# Patient Record
Sex: Male | Born: 1935 | Race: White | Hispanic: No | Marital: Married
Health system: Southern US, Community
[De-identification: ages and names within clinical notes are randomized; demographics above are authoritative.]

---

## 2005-06-25 ENCOUNTER — Emergency Department: Payer: Self-pay | Admitting: Emergency Medicine

## 2005-07-04 ENCOUNTER — Emergency Department: Payer: Self-pay | Admitting: Emergency Medicine

## 2007-06-13 ENCOUNTER — Emergency Department: Payer: Self-pay | Admitting: Emergency Medicine

## 2007-06-21 ENCOUNTER — Emergency Department: Payer: Self-pay | Admitting: Emergency Medicine

## 2008-10-06 ENCOUNTER — Emergency Department: Payer: Self-pay | Admitting: Emergency Medicine

## 2010-06-03 IMAGING — CT CT CERVICAL SPINE WITHOUT CONTRAST
3 series · 17 of 35 positions shown, 19 images · non-contrast
Comparison: none

REASON FOR EXAM: pain after head trauma
COMMENTS:

[Series 3: axial · axial · 0.34mm/px · z∈[+71,+202]mm · 8 of 81 slices shown, 10 images]
[im 7/81  soft-tissue]
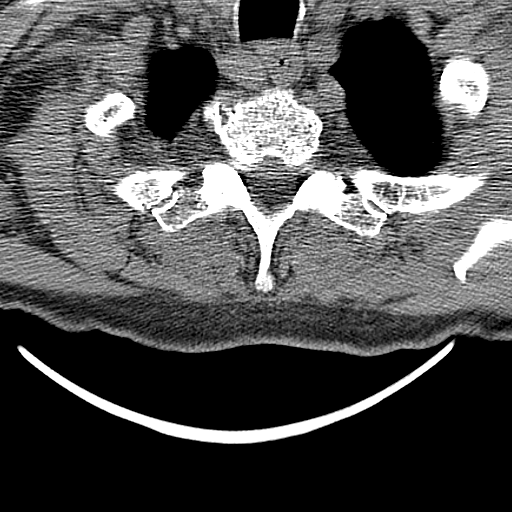
[im 7/81  bone]
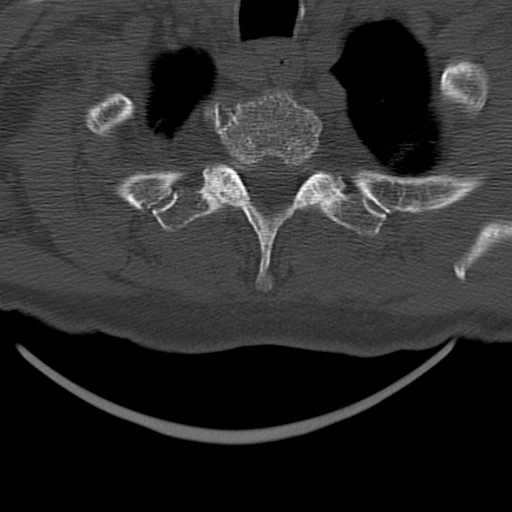
[im 19/81  bone]
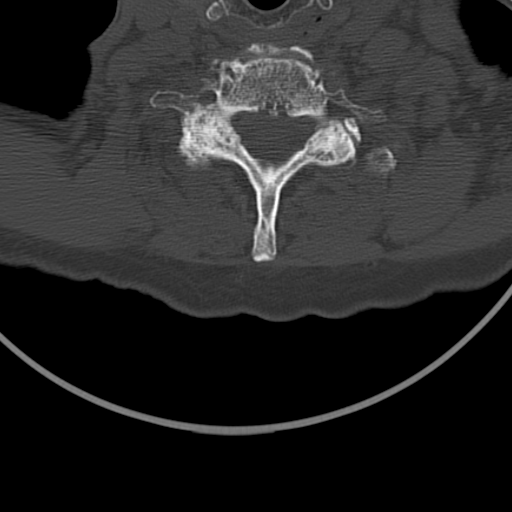
[im 25/81  bone]
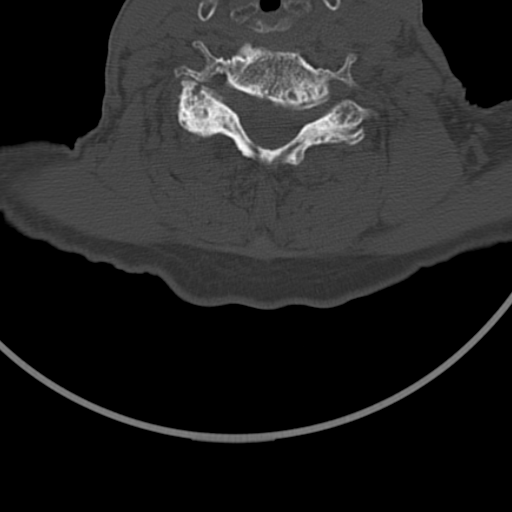
[im 37/81  bone]
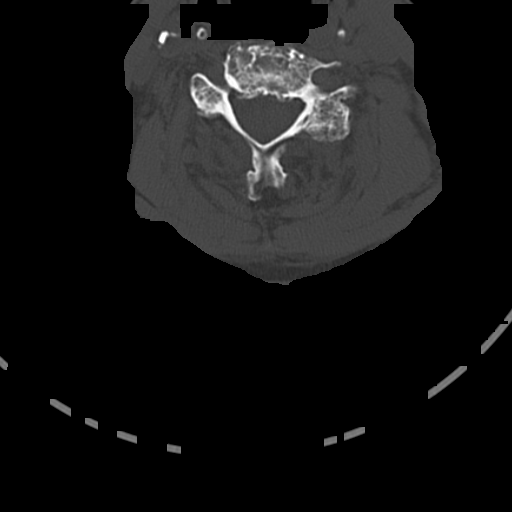
[im 44/81  soft-tissue]
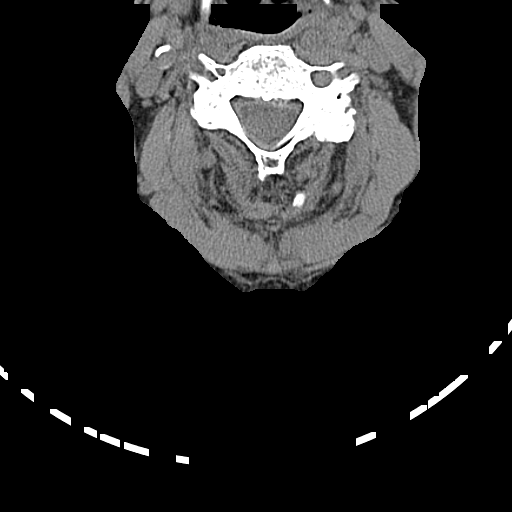
[im 44/81  bone]
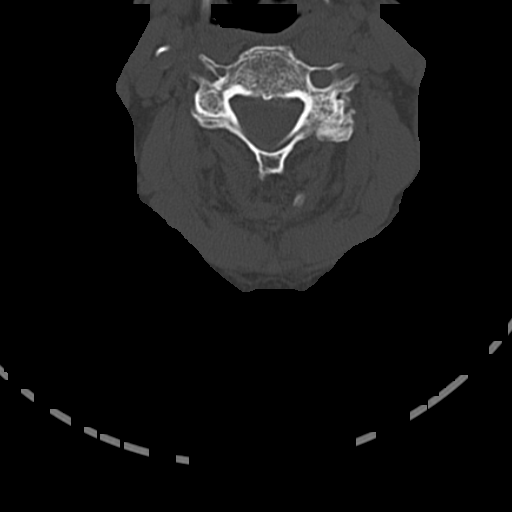
[im 56/81  bone]
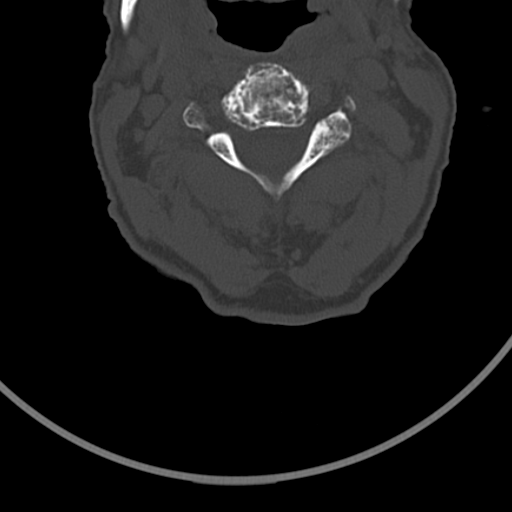
[im 62/81  bone]
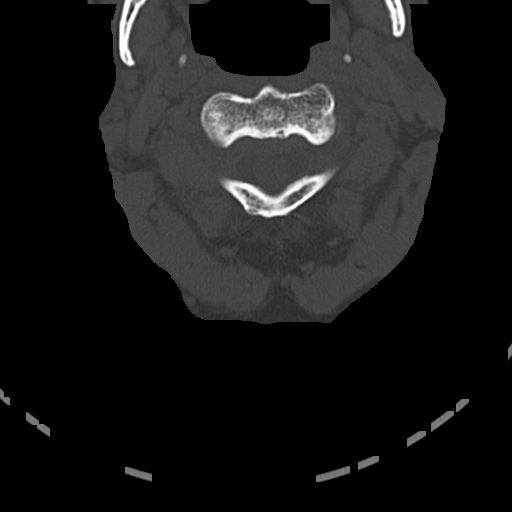
[im 74/81  bone]
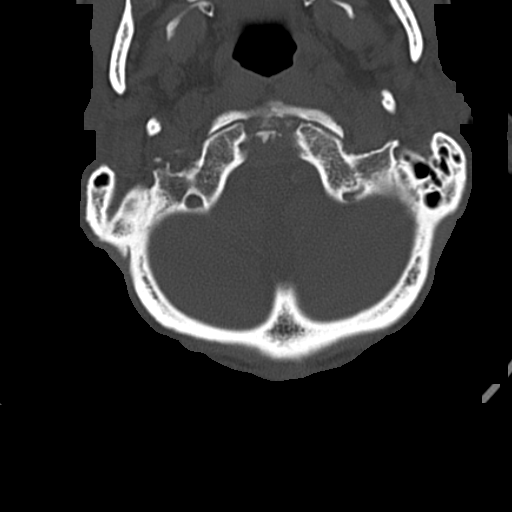

[Series 4: sagittal · sagittal · 0.42mm/px · 6 of 48 slices shown]
[im 24/48  soft-tissue]
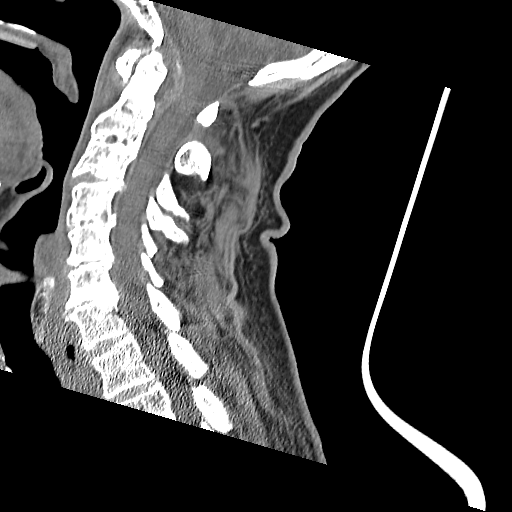
[im 31/48  bone]
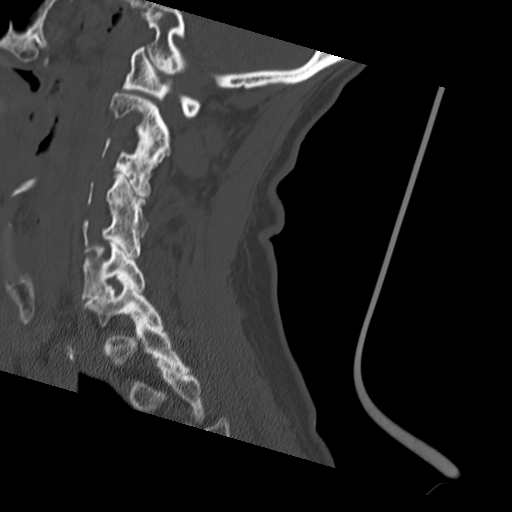
[im 33/48  bone]
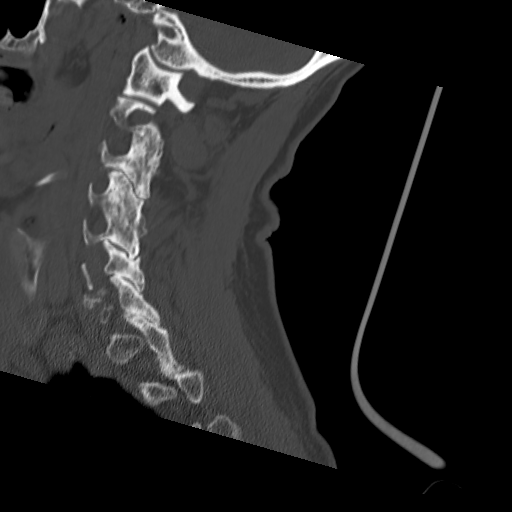
[im 35/48  bone]
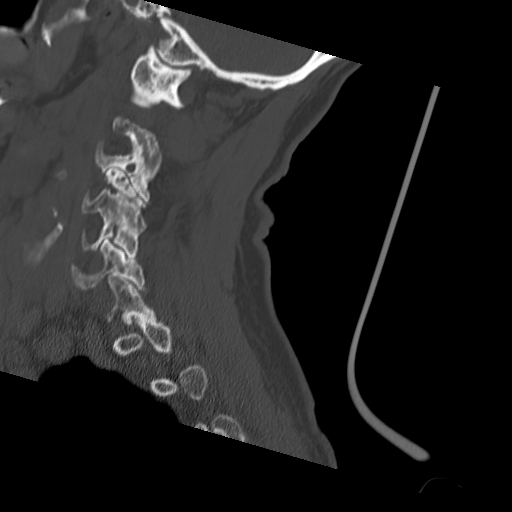
[im 37/48  bone]
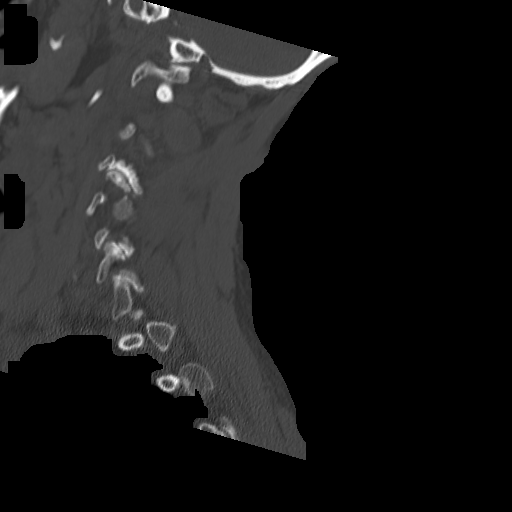
[im 39/48  bone]
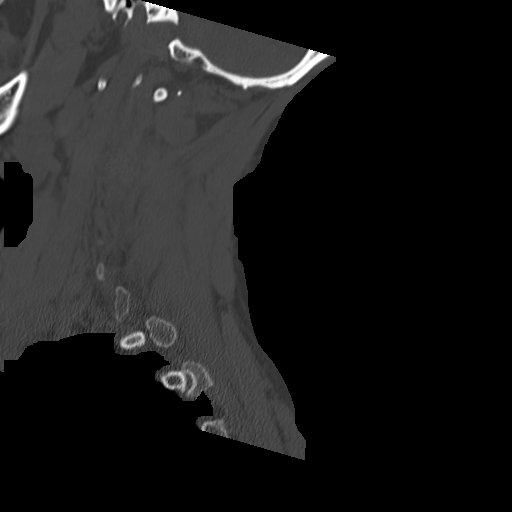

[Series 5: coronal · coronal · 0.42mm/px · 3 of 47 slices shown]
[im 10/47  bone]
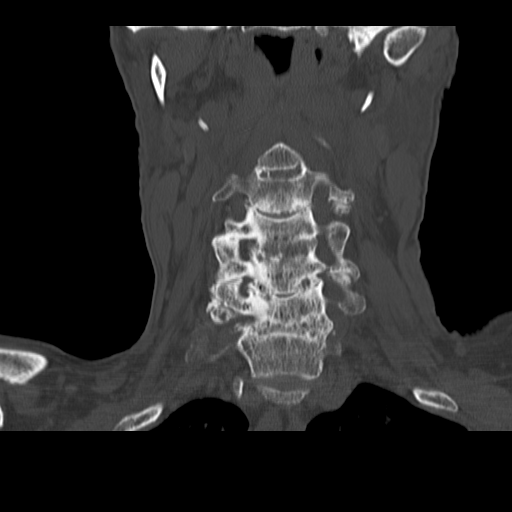
[im 19/47  bone]
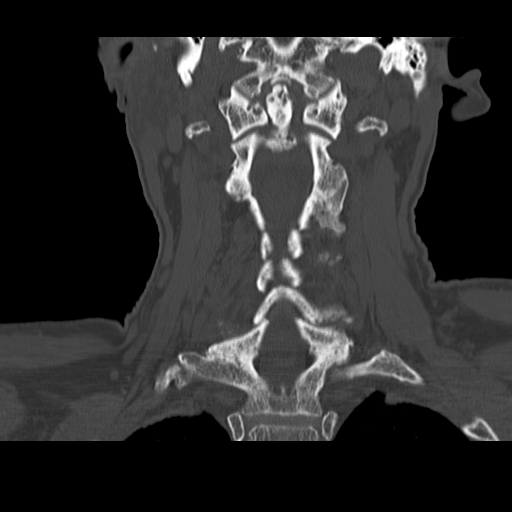
[im 28/47  bone]
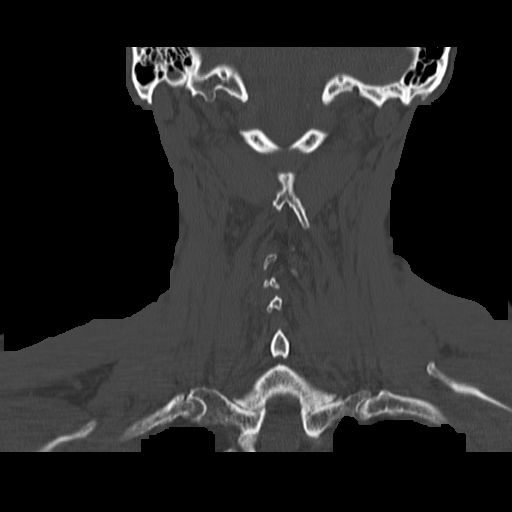

[17 of 35 positions shown; findings below may reference images not displayed]

PROCEDURE:     CT  - CT CERVICAL SPINE WO  - October 06, 2008 [DATE]

RESULT:     Sagittal, axial, and coronal images were obtained through the
cervical spine.

 The cervical vertebral bodies are preserved in height. There is disc space
narrowing noted at multiple levels of the mid cervical spine. The
prevertebral soft tissue spaces appear normal. Small posterior endplate
spurs are present at multiple levels but these do not produce high-grade
bony spinal stenosis. There is no evidence of a jumped facet. The odontoid
is intact. The lateral masses of C1 align normally with those of C2.
IMPRESSION: There is moderate age-appropriate degenerative change of
the cervical spine. I do not see evidence of an acute fracture nor of
dislocation.

A preliminary report was sent to the [HOSPITAL] the conclusion
of the study

## 2010-06-03 IMAGING — CT CT HEAD WITHOUT CONTRAST
2 series · 16 of 30 positions shown, 20 images · non-contrast
Comparison: none

REASON FOR EXAM: frontal trauma and headache
COMMENTS:

[Series 2: without · axial · non-contrast · 0.43mm/px · z∈[+212,+332]mm · 13 of 29 slices shown, 17 images]
[im 3/29  brain]
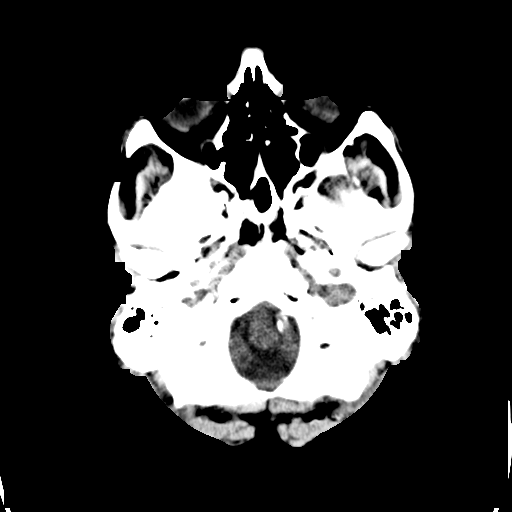
[im 3/29  bone]
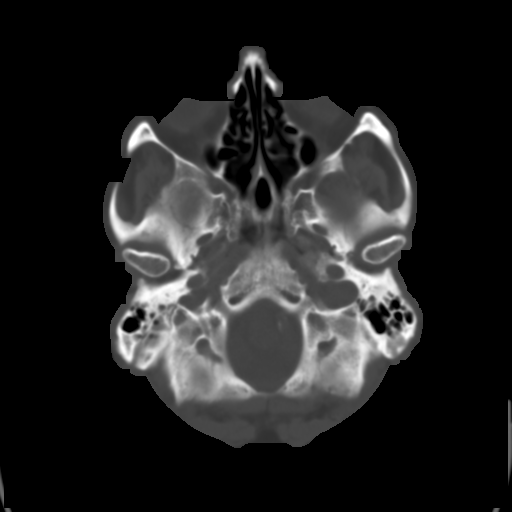
[im 5/29  brain]
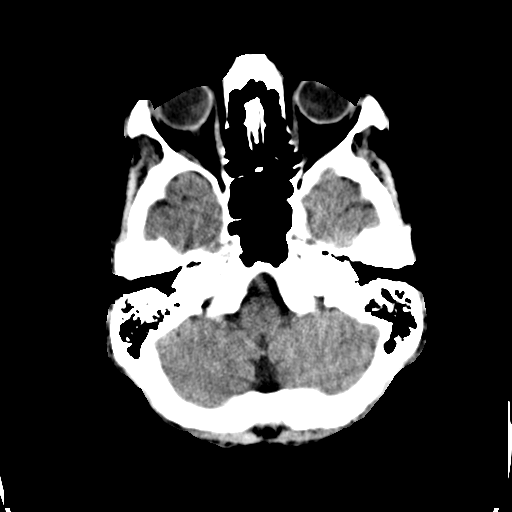
[im 7/29  brain]
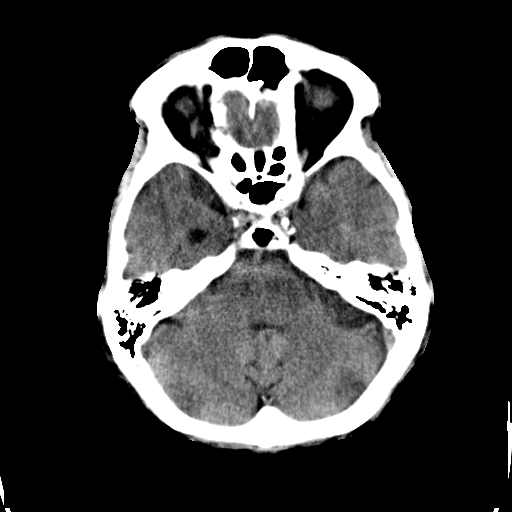
[im 9/29  brain]
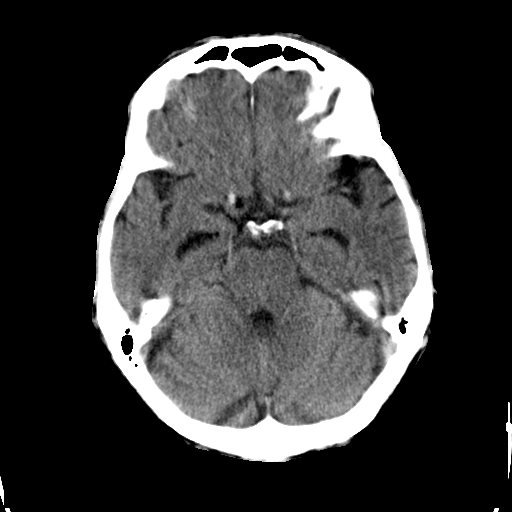
[im 11/29  brain]
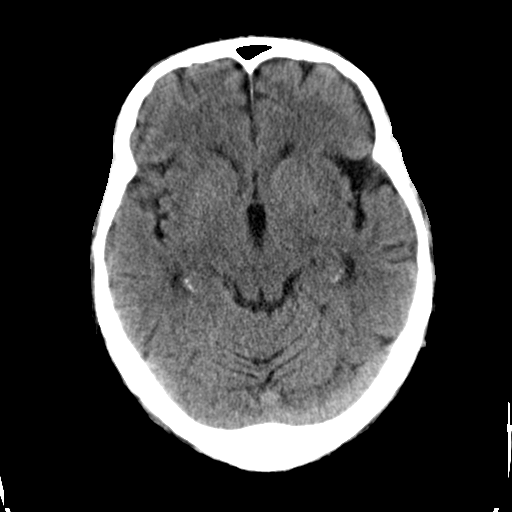
[im 11/29  bone]
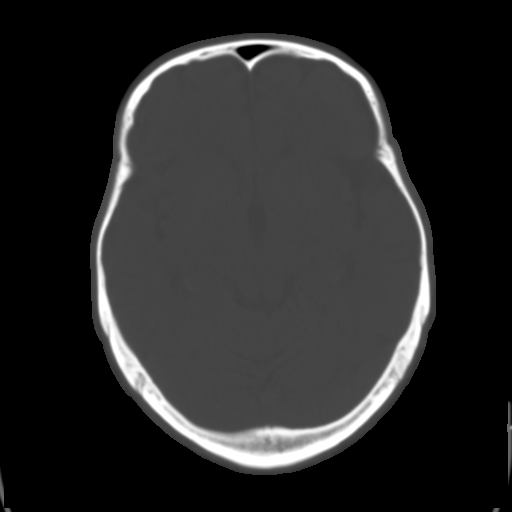
[im 13/29  brain]
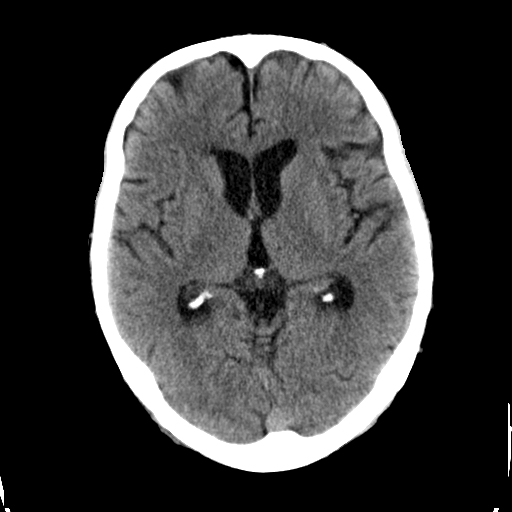
[im 15/29  brain]
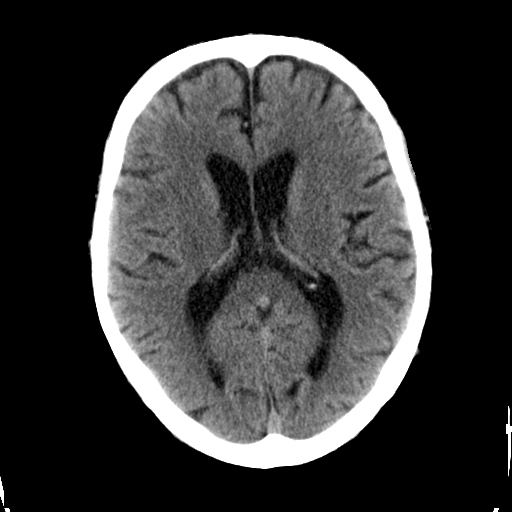
[im 17/29  brain]
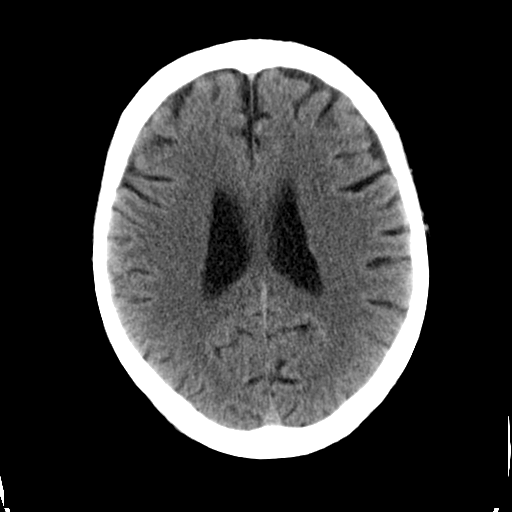
[im 19/29  brain]
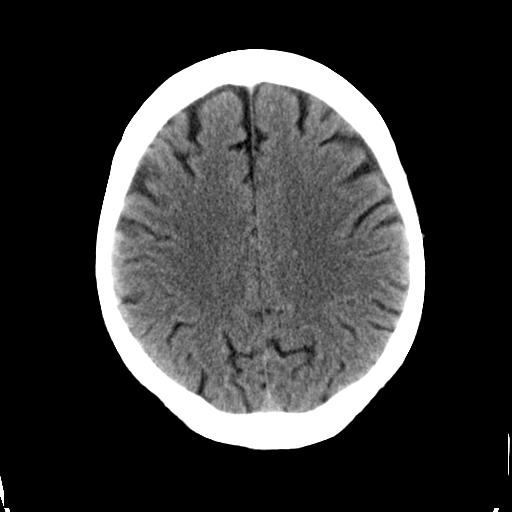
[im 19/29  bone]
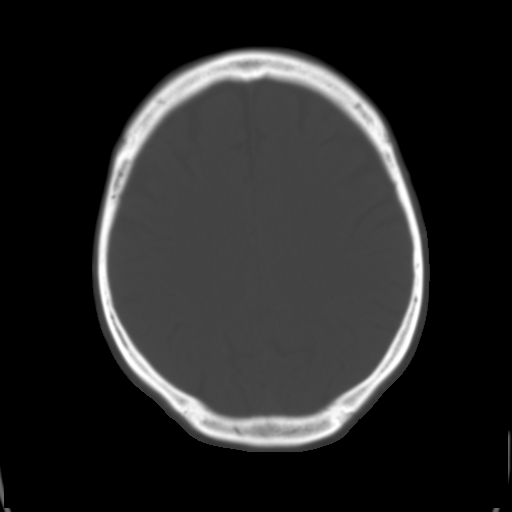
[im 21/29  brain]
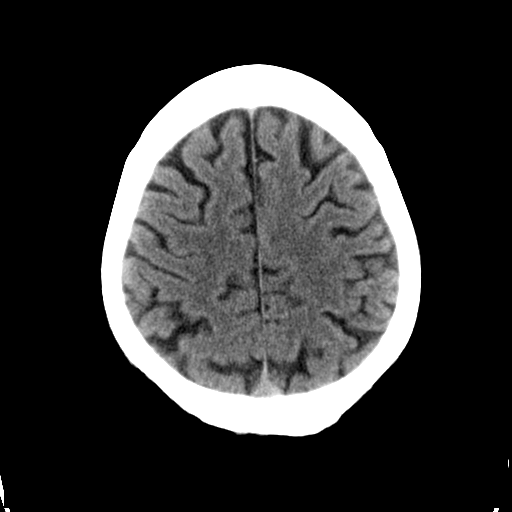
[im 23/29  brain]
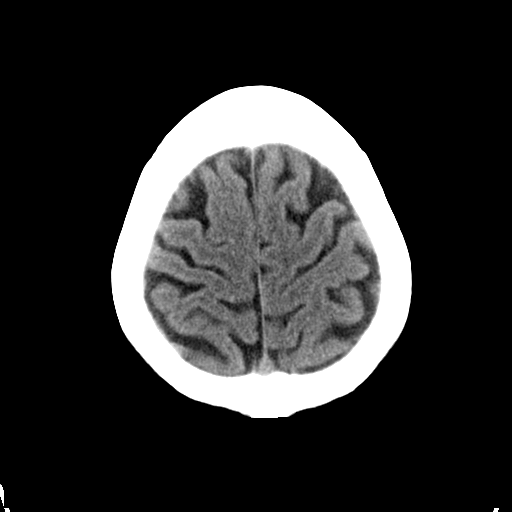
[im 25/29  brain]
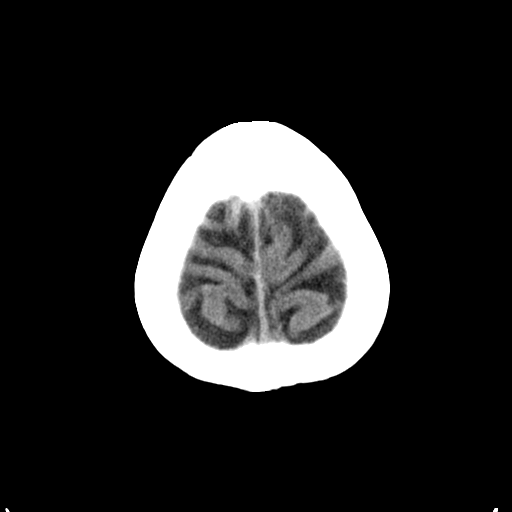
[im 27/29  brain]
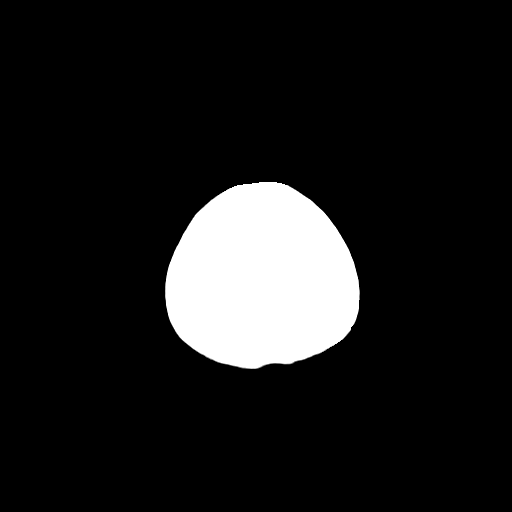
[im 27/29  bone]
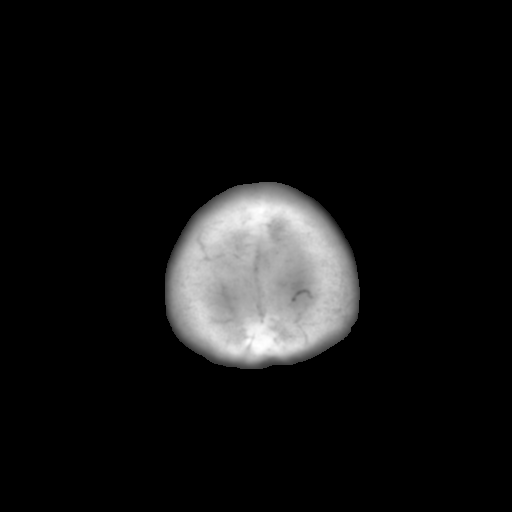

[Series 3: bone · axial · 0.43mm/px · z∈[+212,+252]mm · 3 of 29 slices shown]
[im 3/29  bone]
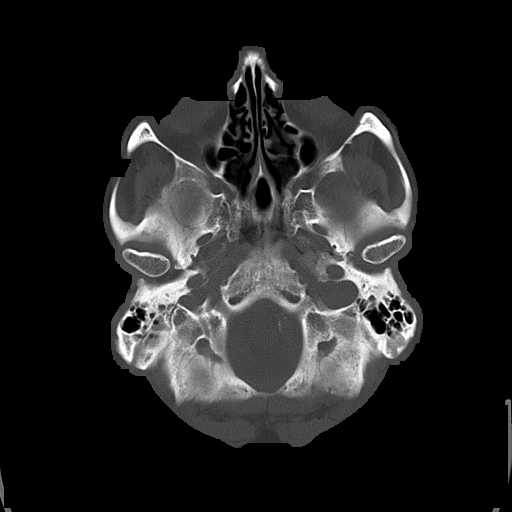
[im 7/29  bone]
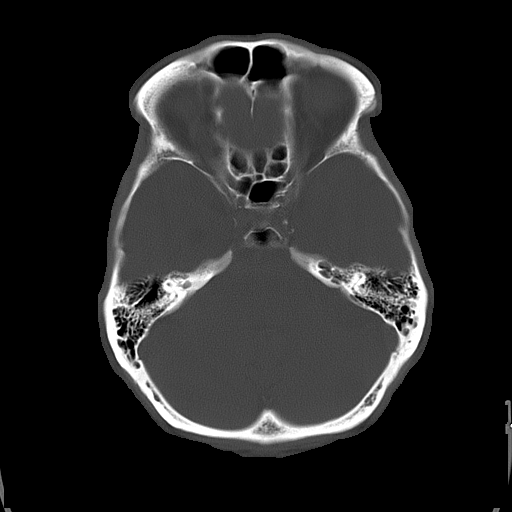
[im 11/29  bone]
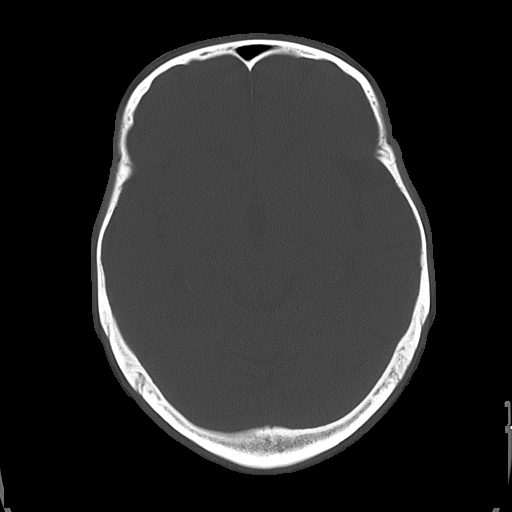

[16 of 30 positions shown; findings below may reference images not displayed]

PROCEDURE:     CT  - CT HEAD WITHOUT CONTRAST  - October 06, 2008 [DATE]

RESULT:     Axial noncontrast CT scanning was performed through the brain at
5 mm intervals and slice thicknesses.

There is mild diffuse age-appropriate cerebral and cerebellar atrophy. The
ventricles are normal in size and position. There is no intracranial
hemorrhage nor intracranial mass effect. The cerebellum and brainstem are
normal in density. At bone window settings the observed portions of the
paranasal sinuses are clear. I do not see evidence of an acute skull
fracture.
IMPRESSION: There are mild age-appropriate atrophic changes present. I
do not see evidence of acute intracranial abnormality. Followup imaging is
available if the patient's symptoms warrant further evaluation.

A preliminary report was sent to the [HOSPITAL] the conclusion
of the study.

## 2014-03-17 ENCOUNTER — Emergency Department: Payer: Self-pay | Admitting: Emergency Medicine

## 2014-03-17 LAB — COMPREHENSIVE METABOLIC PANEL
ALBUMIN: 3.3 g/dL — AB (ref 3.4–5.0)
ALK PHOS: 95 U/L
AST: 24 U/L (ref 15–37)
Anion Gap: 11 (ref 7–16)
BUN: 60 mg/dL — AB (ref 7–18)
Bilirubin,Total: 0.3 mg/dL (ref 0.2–1.0)
CALCIUM: 8.6 mg/dL (ref 8.5–10.1)
CHLORIDE: 102 mmol/L (ref 98–107)
Co2: 26 mmol/L (ref 21–32)
Creatinine: 3.58 mg/dL — ABNORMAL HIGH (ref 0.60–1.30)
EGFR (Non-African Amer.): 17 — ABNORMAL LOW
GFR CALC AF AMER: 21 — AB
GLUCOSE: 134 mg/dL — AB (ref 65–99)
Osmolality: 296 (ref 275–301)
Potassium: 5.3 mmol/L — ABNORMAL HIGH (ref 3.5–5.1)
SGPT (ALT): 25 U/L
SODIUM: 139 mmol/L (ref 136–145)
Total Protein: 6.8 g/dL (ref 6.4–8.2)

## 2014-03-17 LAB — URINALYSIS, COMPLETE
Bacteria: NONE SEEN
Bilirubin,UR: NEGATIVE
Blood: NEGATIVE
Glucose,UR: NEGATIVE mg/dL (ref 0–75)
KETONE: NEGATIVE
NITRITE: NEGATIVE
PH: 7 (ref 4.5–8.0)
Protein: 100
Specific Gravity: 1.011 (ref 1.003–1.030)
WBC UR: 10 /HPF (ref 0–5)

## 2014-03-17 LAB — CBC
HCT: 27.2 % — ABNORMAL LOW (ref 40.0–52.0)
HGB: 8.7 g/dL — AB (ref 13.0–18.0)
MCH: 30.1 pg (ref 26.0–34.0)
MCHC: 31.9 g/dL — ABNORMAL LOW (ref 32.0–36.0)
MCV: 94 fL (ref 80–100)
Platelet: 222 10*3/uL (ref 150–440)
RBC: 2.89 10*6/uL — AB (ref 4.40–5.90)
RDW: 16.5 % — AB (ref 11.5–14.5)
WBC: 10.2 10*3/uL (ref 3.8–10.6)

## 2014-03-17 LAB — PROTIME-INR
INR: 1.1
Prothrombin Time: 13.6 secs (ref 11.5–14.7)

## 2014-03-17 LAB — LIPASE, BLOOD: LIPASE: 226 U/L (ref 73–393)

## 2014-03-17 LAB — TROPONIN I: Troponin-I: 0.03 ng/mL

## 2014-05-15 DEATH — deceased

## 2014-09-05 NOTE — H&P (Signed)
PATIENT NAME:  Troy Navarro, Troy Navarro MR#:  [id number] DATE OF BIRTH:  [birth year]  DATE OF ADMISSION:  03/17/2014  CHIEF COMPLAINT: Blood per rectum.   HISTORY OF PRESENT ILLNESS: Troy Navarro is an 81 year old Caucasian gentleman with history of stage IV CKD, history of bladder cancer, status post treatment, hypertension, benign prostatic hypertrophy, comes in from [facility] after the patient woke up around 3:00 in the morning with some abdominal discomfort. He had a bowel movement along with (Dictation Anomaly) <<MISSING TEXT>> stool noted to have dark as well as stool mixed with bright red blood.  He was sent to the Emergency Room where he is hemodynamically stable. The patient has known history of diverticulosis from colonoscopy in the past. He is being admitted for further evaluation and management. The patient feels weak and denies any chest pain or shortness of breath.   PAST MEDICAL HISTORY:  1.  History of BPH.  2.  Depression.  3.  Chronic leg cramps.  4.  Arthritis.  5.  Hypertension.  6.  Known history of diverticulosis per colonoscopy.  7.  Hypertension.  8.  History of bladder cancer, status post therapy with immunoglobulin. The patient is getting treatment at the [facility].  9.  Stage IV CKD.  10. Left-sided nephrectomy due to renal mass in July 2014.   11. Hip fracture.   ALLERGIES: No known drug allergies.   FAMILY HISTORY: Positive for hypertension.   SOCIAL HISTORY: Currently getting rehab at [facility], recently discharged from [facility] for UTI and sepsis.   HOME MEDICATIONS:  1.  Tamsulosin 0.4 mg p.o. daily.  2.  Sodium bicarbonate 650 at 1 b.i.d.  3.  Simvastatin 20 mg daily at bedtime.  4.  Sertraline 50 mg daily at bedtime.  5.  Nitroglycerin 0.4 mg sublingual as needed.  6.  Nifedipine extended release 60 mg 2 tablets once a day.  7.  Multivitamin p.o. daily.  8.  Hydralazine 25 mg every 8 hours.  9.  Haldol 0.5 mg p.o. daily at bedtime.  10. Finasteride  5 mg daily.  11. Ferrous sulfate 325 mg p.o. daily.  12. Aspirin 81 mg daily.  13. Artificial tears 2 drops in both eyes 4 times a day.   REVIEW OF SYSTEMS:   CONSTITUTIONAL: Positive for fatigue, weakness. No fever.  EYES: No blurred, double vision, glaucoma.  ENT: No tinnitus, ear pain, hearing loss.  RESPIRATORY: No cough, wheeze, hemoptysis.  CARDIOVASCULAR: No chest pain, orthopnea. Positive hypertension.  GASTROINTESTINAL: Positive for rectal bleed, mild abdominal discomfort. No nausea, vomiting, or diarrhea.  GENITOURINARY: No dysuria, hematuria, or frequency.  ENDOCRINE: No polyuria, nocturia or thyroid problems.  HEMATOLOGY: Positive for chronic anemia. No easy bruising or bleeding.  SKIN: No acne or rash.  MUSCULOSKELETAL: Positive for arthritis and no swelling, gout. Positive for generalized weakness.  NEUROLOGIC: No CVA, TIA or dysarthria.  PSYCHIATRIC: No anxiety or depression.  All other systems reviewed and negative.   PHYSICAL EXAMINATION:  GENERAL: The patient is awake, alert, oriented x 3, not in acute distress, appears weak.  VITAL SIGNS:  Temperature 97.4, blood pressure is 191/75. Pulse is 63 and sats are 99% on room air.  HEENT: Atraumatic, normocephalic. Pupils PERLA. EOM intact. Oral mucosa is moist.  NECK: Supple. No JVD. No carotid bruit.  LUNGS: Clear to auscultation bilaterally. No rales, rhonchi, respiratory distress or labored breathing.  CARDIOVASCULAR:   Both the heart sounds are normal. Rate, rhythm regular. PMI not lateralized. Chest is  nontender. Good pedal pulses, good femoral pulses, Trace lower extremity edema.    ABDOMEN: Soft, benign, nontender. Positive bowel sounds.  NEUROLOGIC: Grossly intact cranial nerves II through XII. No motor or sensory deficit.  PSYCHIATRIC: The patient is awake, alert, oriented x 3.  SKIN: Warm and dry, generalized pallor present.   DIAGNOSTIC DATA:  Urinalysis positive negative for urinary tract infection.  Chest  x-ray; no acute cardiopulmonary abnormality. Hemoglobin and hematocrit is 8.7 and 27.2, platelet count is 222,000, MCV is 94.  Lipase is 226, glucose is 134, BUN is 60, creatinine is 3.58, sodium is 139, potassium is 3.9. LFTs within normal limits. Albumin 3.3. PT/INR within normal limits.  EKG shows normal sinus rhythm.  ASSESSMENT: An 81 year old, Mr. Troy Navarro, with history of bladder cancer, leg cramps, arthritis, hypertension, comes in with:  1.  Rectal bleed.  The patient has known history of diverticulosis. This could be very well a diverticular bleed.  The patient will be admitted. The patient is currently in the Emergency Room awaiting to hear from [facility] if they have a bed.  The patient will be transferred off to [facility]. His hemoglobin and hematocrit is stable.  We will continue to monitor hemoglobin around the clock, transfuse as needed, continue IV fluids and p.o. Protonix.  Gastrointestinal consultation has been placed.  2.  Hypertension. Continue nifedipine and hydralazine.  3.  History of benign prostatic hypertrophy on tamsulosin and finasteride.  4.  Chronic anemia of chronic kidney disease, stage IV.  Continue ferrous sulfate. Transfuse as needed.  5.  Chronic kidney disease stage IV. We will continue the patient with some IV fluids, monitor ins and outs. [facility]  6.  Hyperlipidemia, on simvastatin.  7.  History of depression on Zoloft. Continue Zoloft.  8.  Deep vein thrombosis prophylaxis. We will avoid antiplatelet agents. The patient will be given teds and SCDs..    I did have a lengthy discussion with the patient and the patient's wife, Joylene IgoSusan Heyward, on the phone.  The patient is FULL CODE.     TIME SPENT: 55 minutes.     ____________________________ Wylie HailSona A. Allena KatzPatel, MD sap:DT D: 03/17/2014 10:35:56 ET T: 03/17/2014 11:07:58 ET JOB#: [id number]  cc: Matylda Fehring A. Allena KatzPatel, MD, <Dictator> [facility]

## 2014-09-05 NOTE — Consult Note (Signed)
PATIENT NAME:  Troy Navarro, Troy Navarro MR#:  [id number] DATE OF BIRTH:  1924-11-25  DATE OF CONSULTATION:  03/17/2014  CHIEF COMPLAINT: Blood per rectum.   HISTORY OF PRESENT ILLNESS: Mr. Troy Navarro is an 81 year old Caucasian gentleman with history of stage IV CKD, history of bladder cancer, status post treatment, hypertension, benign prostatic hypertrophy, [facility] after the patient woke up around 3:00 in the morning with some abdominal discomfort. He had a bowel movement along with incontent stool noted to have dark as well as stool mixed with bright red blood.  He was sent to the Emergency Room where he is hemodynamically stable. The patient has known history of diverticulosis from colonoscopy in the past. He is being admitted for further evaluation and management. The patient feels weak and denies any chest pain or shortness of breath.   PAST MEDICAL HISTORY:  1.  History of BPH.  2.  Depression.  3.  Chronic leg cramps.  4.  Arthritis.  5.  Hypertension.  6.  Known history of diverticulosis per colonoscopy.  7.  Hypertension.  8.  History of bladder cancer, status post therapy with immunoglobulin. [facility].  9.  Stage IV CKD.  10. Left-sided nephrectomy due to renal mass in July 2014.   11. Hip fracture.   ALLERGIES: No known drug allergies.   FAMILY HISTORY: Positive for hypertension.   SOCIAL HISTORY: [facility], [facility] and [facility].   HOME MEDICATIONS:  1.  Tamsulosin 0.4 mg p.o. daily.  2.  Sodium bicarbonate 650 at 1 b.i.d.  3.  Simvastatin 20 mg daily at bedtime.  4.  Sertraline 50 mg daily at bedtime.  5.  Nitroglycerin 0.4 mg sublingual as needed.  6.  Nifedipine extended release 60 mg 2 tablets once a day.  7.  Multivitamin p.o. daily.  8.  Hydralazine 25 mg every 8 hours.  9.  Haldol 0.5 mg p.o. daily at bedtime.  10. Finasteride 5 mg daily.  11.  Ferrous sulfate 325 mg p.o. daily.  12. Aspirin 81 mg daily.  13. Artificial tears 2 drops in both eyes 4 times a day.   REVIEW OF SYSTEMS:   CONSTITUTIONAL: Positive for fatigue, weakness. No fever.  EYES: No blurred, double vision, glaucoma.  ENT: No tinnitus, ear pain, hearing loss.  RESPIRATORY: No cough, wheeze, hemoptysis.  CARDIOVASCULAR: No chest pain, orthopnea. Positive hypertension.  GASTROINTESTINAL: Positive for rectal bleed, mild abdominal discomfort. No nausea, vomiting, or diarrhea.  GENITOURINARY: No dysuria, hematuria, or frequency.  ENDOCRINE: No polyuria, nocturia or thyroid problems.  HEMATOLOGY: Positive for chronic anemia. No easy bruising or bleeding.  SKIN: No acne or rash.  MUSCULOSKELETAL: Positive for arthritis and no swelling, gout. Positive for generalized weakness.  NEUROLOGIC: No CVA, TIA or dysarthria.  PSYCHIATRIC: No anxiety or depression.  All other systems reviewed and negative.   PHYSICAL EXAMINATION:  GENERAL: The patient is awake, alert, oriented x 3, not in acute distress, appears weak.  VITAL SIGNS:  Temperature 97.4, blood pressure is 191/75. Pulse is 63 and sats are 99% on room air.  HEENT: Atraumatic, normocephalic. Pupils PERLA. EOM intact. Oral mucosa is moist.  NECK: Supple. No JVD. No carotid bruit.  LUNGS: Clear to auscultation bilaterally. No rales, rhonchi, respiratory distress or labored breathing.  CARDIOVASCULAR:   Both the heart sounds are normal. Rate, rhythm regular. PMI not lateralized. Chest is nontender. Good pedal  pulses, good femoral pulses, Trace lower extremity edema.    ABDOMEN: Soft, benign, nontender. Positive bowel sounds.  NEUROLOGIC: Grossly intact cranial nerves II through XII. No motor or sensory deficit.  PSYCHIATRIC: The patient is awake, alert, oriented x 3.  SKIN: Warm and dry, generalized pallor present.   DIAGNOSTIC DATA:  Urinalysis positive negative for urinary tract infection.  Chest x-ray; no acute  cardiopulmonary abnormality. Hemoglobin and hematocrit is 8.7 and 27.2, platelet count is 222,000, MCV is 94.  Lipase is 226, glucose is 134, BUN is 60, creatinine is 3.58, sodium is 139, potassium is 3.9. LFTs within normal limits. Albumin 3.3. PT/INR within normal limits.  EKG shows normal sinus rhythm.  ASSESSMENT: An 81 year old, Troy Navarro, with history of bladder cancer, leg cramps, arthritis, hypertension, comes in with:  1.  Rectal bleed.  The patient has known history of diverticulosis. This could be very well a diverticular bleed.  The patient will be admitted. The patient is currently in the Emergency Room awaiting to hear from [place] if they have a bed.  The patient will be transferred off to [place]. His hemoglobin and hematocrit is stable.  We will continue to monitor hemoglobin around the clock, transfuse as needed, continue IV fluids and p.o. Protonix.  Gastrointestinal consultation has been placed.  2.  Hypertension. Continue nifedipine and hydralazine.  3.  History of benign prostatic hypertrophy on tamsulosin and finasteride.  4.  Chronic anemia of chronic kidney disease, stage IV.  Continue ferrous sulfate. Transfuse as needed.  5.  Chronic kidney disease stage IV. We will continue the patient with some IV fluids, monitor ins and outs. He follows up with nephrology at [facility].  6.  Hyperlipidemia, on simvastatin.  7.  History of depression on Zoloft. Continue Zoloft.  8.  Deep vein thrombosis prophylaxis. We will avoid antiplatelet agents. The patient will be given teds and SCDs..    I did have a lengthy discussion with the patient and the patient's wife, Troy Navarro, on the phone.  The patient is FULL CODE.     TIME SPENT: 55 minutes.     ____________________________ Wylie Hail Allena Katz, MD sap:DT D: 03/17/2014 10:35:56 ET T: 03/17/2014 11:07:58 ET JOB#: [id number]  cc: Vincie Linn A. Allena Katz, MD, <Dictator> [facility]  Willow Ora MD ELECTRONICALLY SIGNED 04/02/2014 11:50

## 2015-11-12 IMAGING — CR DG CHEST 1V PORT
1 series · 1 of 1 positions shown · non-contrast
Comparison: None.

CLINICAL DATA: Initial evaluation for GI bleed.

EXAM:
PORTABLE CHEST - 1 VIEW

[ap]
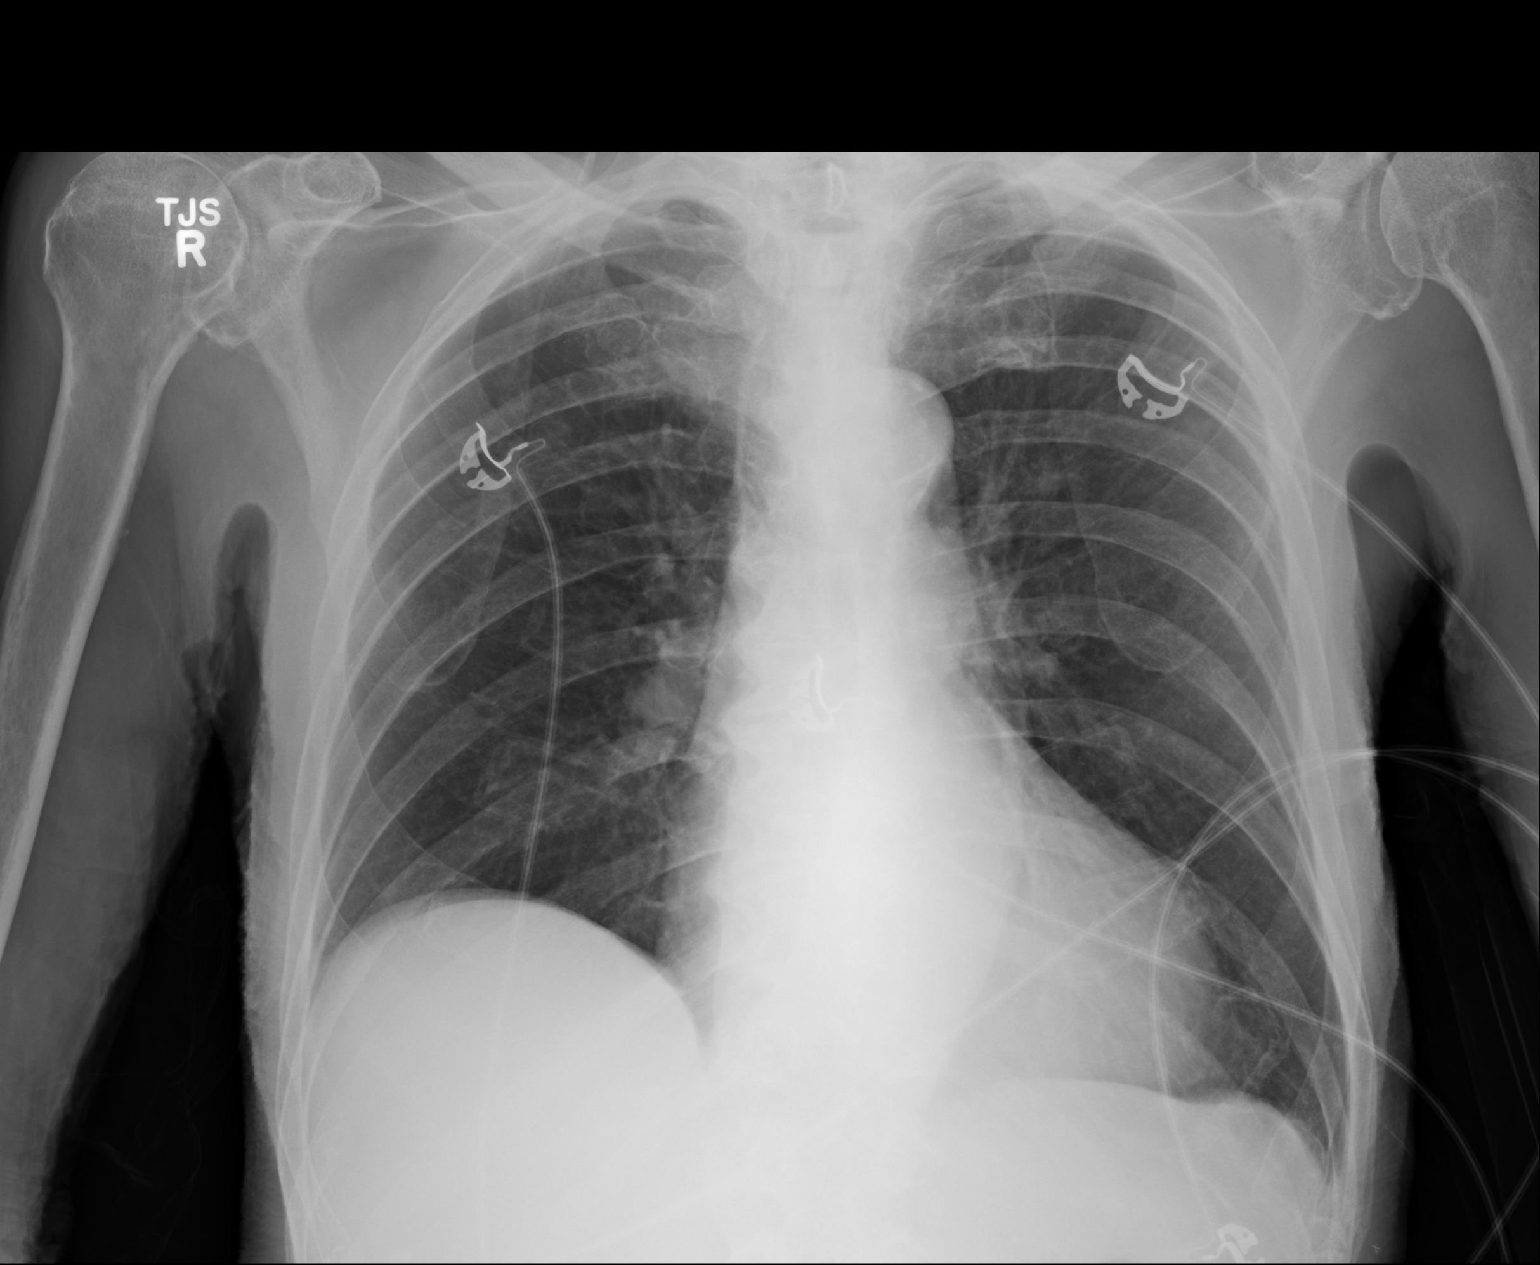

[1 of 1 positions shown; findings below may reference images not displayed]

FINDINGS: The cardiac and mediastinal silhouettes are within normal limits.

The lungs are normally inflated. No airspace consolidation, pleural
effusion, or pulmonary edema is identified. There is no
pneumothorax.

No acute osseous abnormality identified.
IMPRESSION: No active cardiopulmonary disease.
